# Patient Record
Sex: Male | Born: 2000 | Race: White | Hispanic: No | Marital: Single | Smoking: Never smoker
Health system: Southern US, Community
[De-identification: ages and names within clinical notes are randomized; demographics above are authoritative.]

## PROBLEM LIST (undated history)

## (undated) DIAGNOSIS — H9319 Tinnitus, unspecified ear: Secondary | ICD-10-CM

## (undated) DIAGNOSIS — S0300XA Dislocation of jaw, unspecified side, initial encounter: Secondary | ICD-10-CM

## (undated) HISTORY — PX: NO PAST SURGERIES: SHX2092

---
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
## 2021-09-14 IMAGING — CR DG CHEST 2V
2 series · 2 of 2 positions shown · non-contrast
Comparison: None.

CLINICAL DATA: Chest pain

EXAM:
CHEST - 2 VIEW

[w chest pa]
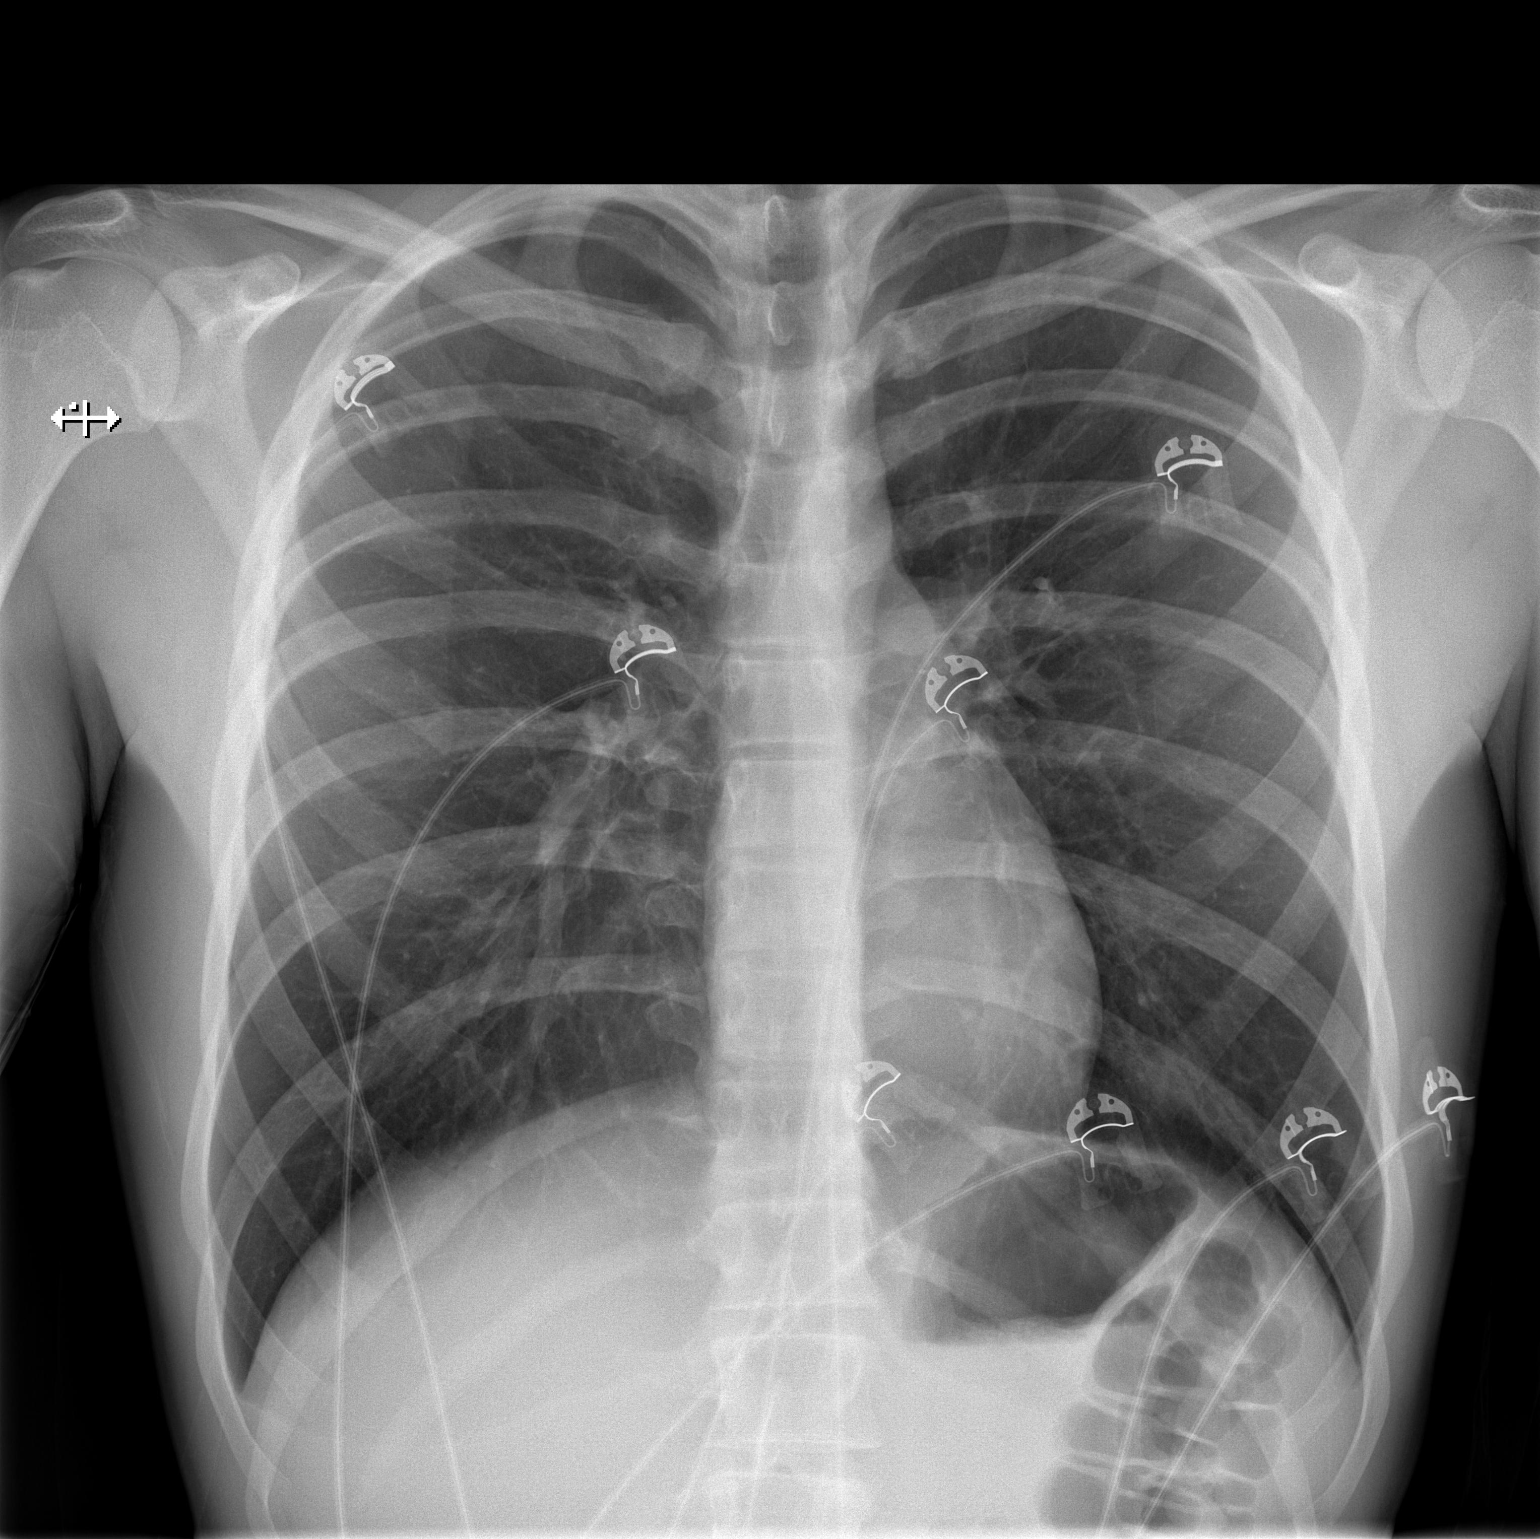

[w chest lat]
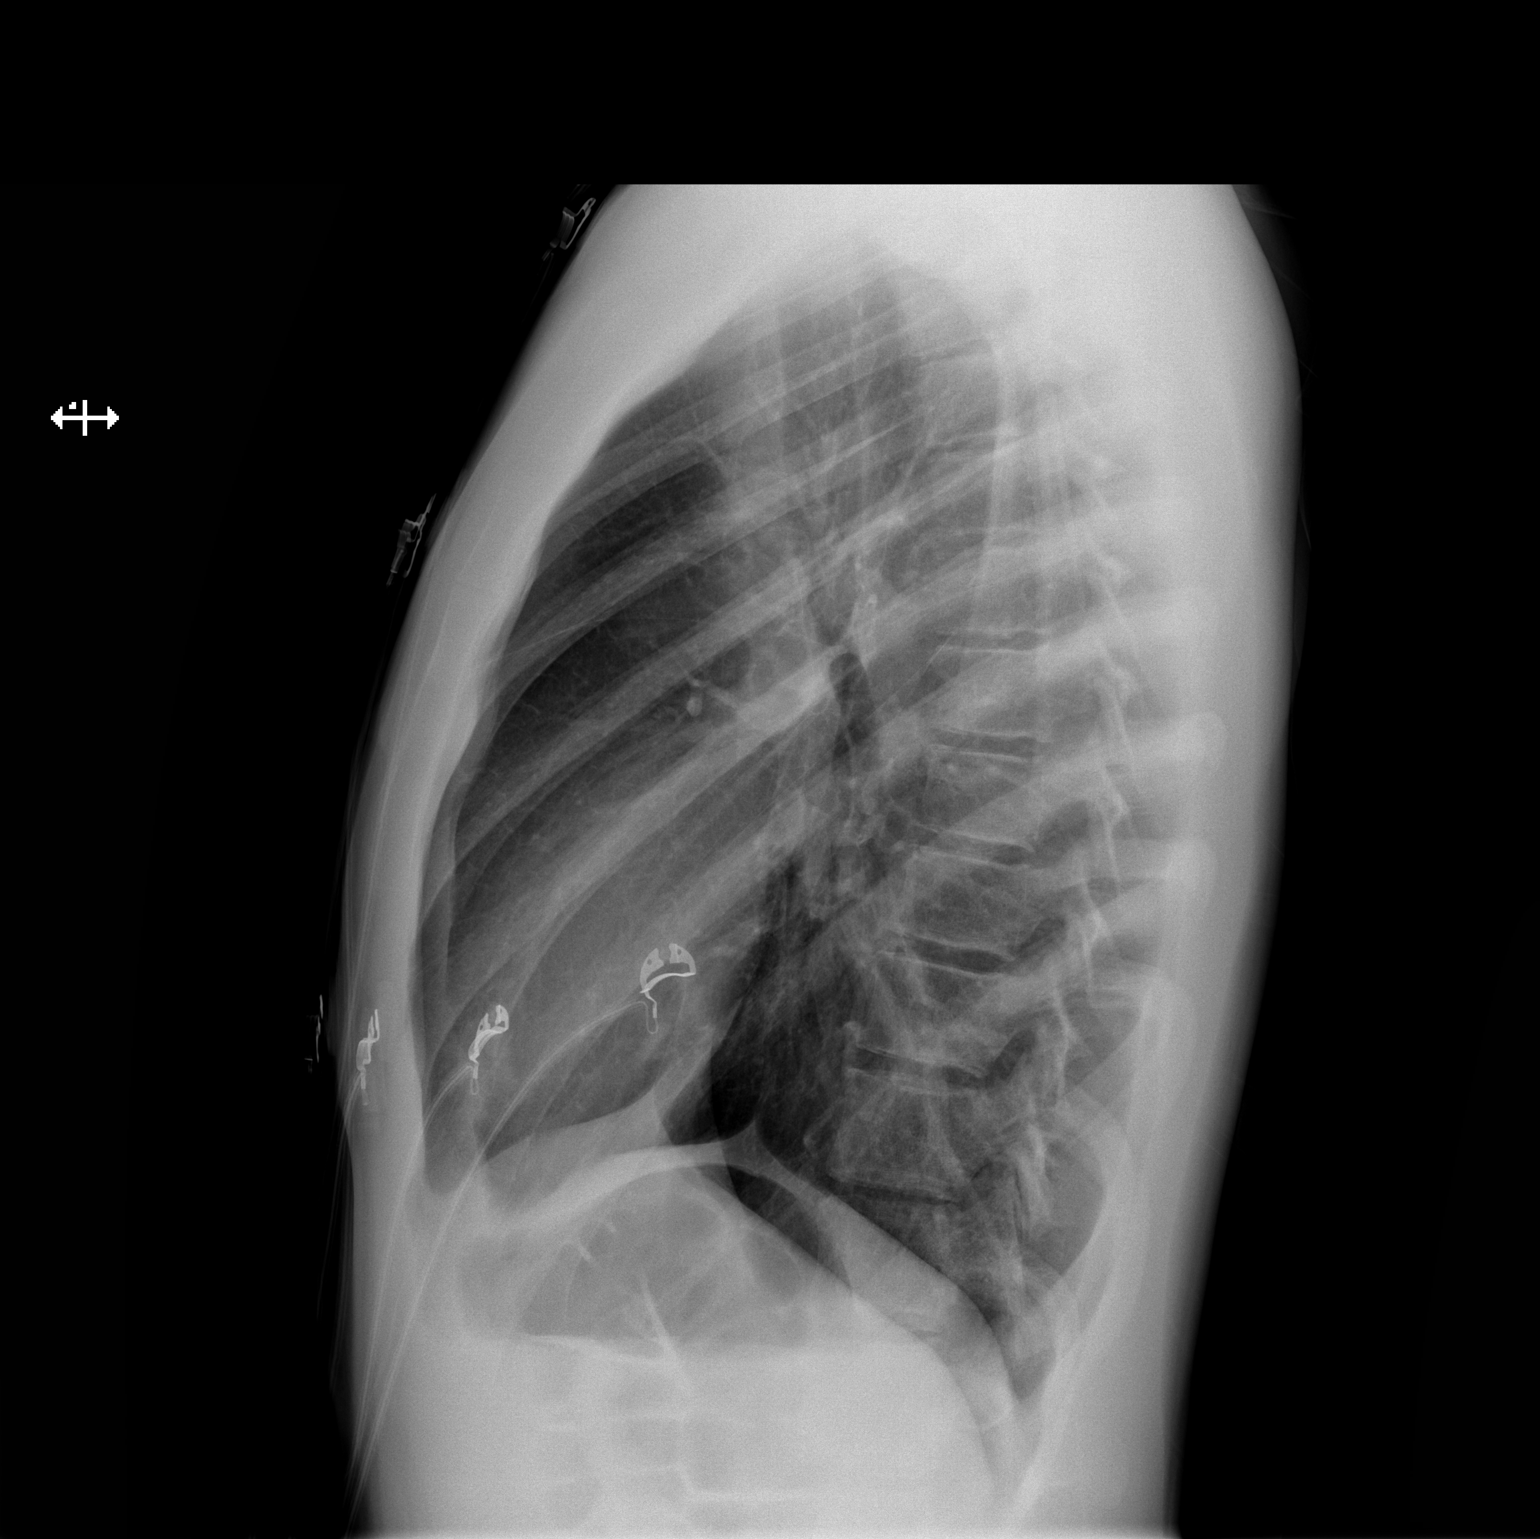

[2 of 2 positions shown; findings below may reference images not displayed]

FINDINGS: The heart size and mediastinal contours are within normal limits.
Both lungs are clear. The visualized skeletal structures are
unremarkable.
IMPRESSION: Normal study.

## 2022-07-25 ENCOUNTER — Other Ambulatory Visit: Payer: Self-pay | Admitting: Orthopaedic Surgery

## 2022-08-03 NOTE — Patient Instructions (Signed)
DUE TO COVID-19 ONLY TWO VISITORS  (aged 20 and older)  ARE ALLOWED TO COME WITH YOU AND STAY IN THE WAITING ROOM ONLY DURING PRE OP AND PROCEDURE.   **NO VISITORS ARE ALLOWED IN THE SHORT STAY AREA OR RECOVERY ROOM!!**  IF YOU WILL BE ADMITTED INTO THE HOSPITAL YOU ARE ALLOWED ONLY FOUR SUPPORT PEOPLE DURING VISITATION HOURS ONLY (7 AM -8PM)   The support person(s) must pass our screening, gel in and out, and wear a mask at all times, including in the patient's room. Patients must also wear a mask when staff or their support person are in the room. Visitors GUEST BADGE MUST BE WORN VISIBLY  One adult visitor may remain with you overnight and MUST be in the room by 8 P.M.     Your procedure is scheduled on: 08/21/22   Report to [facility] Main Entrance    Report to admitting at 9:30 AM   Call this number if you have problems the morning of surgery [phone]   Do not eat food :After Midnight.   After Midnight you may have the following liquids until _9:00_____ AM/ DAY OF SURGERY  Water Black Coffee (sugar ok, NO MILK/CREAM OR CREAMERS)  Tea (sugar ok, NO MILK/CREAM OR CREAMERS) regular and decaf                             Plain Jell-O (NO RED)                                           Fruit ices (not with fruit pulp, NO RED)                                     Popsicles (NO RED)                                                                  Juice: apple, WHITE grape, WHITE cranberry Sports drinks like Gatorade (NO RED)                   The day of surgery:  Drink ONE (1) Pre-Surgery Clear Ensure  at 8:45  AM the morning of surgery. Drink in one sitting. Do not sip.  This drink was given to you during your hospital  pre-op appointment visit. Nothing else to drink after completing the  Pre-Surgery Clear Ensure at 9:00 AM.          If you have questions, please contact your surgeon's office.       Oral Hygiene is also important to reduce your risk of  infection.                                    Remember - BRUSH YOUR TEETH THE MORNING OF SURGERY WITH YOUR REGULAR TOOTHPASTE   Do NOT smoke after Midnight   Take these medicines the morning of surgery with A SIP OF WATER: none  You may not have any metal on your body including jewelry, and body piercing             Do not wear  lotions, powders, cologne, or deodorant                Men may shave face and neck.   Do not bring valuables to the hospital. [place].   Contacts, dentures or bridgework may not be worn into surgery.   DO NOT [place].     Patients discharged on the day of surgery will not be allowed to drive home.  Someone NEEDS to stay with you for the first 24 hours after anesthesia.                 Please read over the following fact sheets you were given: IF YOU HAVE QUESTIONS ABOUT YOUR PRE-OP INSTRUCTIONS PLEASE CALL [phone]    [facility] - Preparing for Surgery Before surgery, you can play an important role.  Because skin is not sterile, your skin needs to be as free of germs as possible.  You can reduce the number of germs on your skin by washing with CHG (chlorahexidine gluconate) soap before surgery.  CHG is an antiseptic cleaner which kills germs and bonds with the skin to continue killing germs even after washing. Please DO NOT use if you have an allergy to CHG or antibacterial soaps.  If your skin becomes reddened/irritated stop using the CHG and inform your nurse when you arrive at Short Stay.  You may shave your face/neck. Please follow these instructions carefully:  1.  Shower with CHG Soap the night before surgery and the  morning of Surgery.  2.  If you choose to wash your hair, wash your hair first as usual with your  normal  shampoo.  3.  After you shampoo, rinse your hair and body thoroughly to remove the  shampoo.                             4.  Use CHG as you would any other liquid soap.  You can apply chg directly  to the skin and wash                       Gently with a scrungie or clean washcloth.  5.  Apply the CHG Soap to your body ONLY FROM THE NECK DOWN.   Do not use on face/ open                           Wound or open sores. Avoid contact with eyes, ears mouth and genitals (private parts).                       Wash face,  Genitals (private parts) with your normal soap.             6.  Wash thoroughly, paying special attention to the area where your surgery  will be performed.  7.  Thoroughly rinse your body with warm water from the neck down.  8.  DO NOT shower/wash with your normal soap after using and rinsing off  the CHG Soap.  9.  Pat yourself dry with a clean towel.            10.  Wear clean pajamas.            11.  Place clean sheets on your bed the night of your first shower and do not  sleep with pets. Day of Surgery : Do not apply any lotions/deodorants the morning of surgery.  Please wear clean clothes to the hospital/surgery center.  FAILURE TO FOLLOW THESE INSTRUCTIONS MAY RESULT IN THE CANCELLATION OF YOUR SURGERY PATIENT SIGNATURE_________________________________  NURSE SIGNATURE__________________________________  ________________________________________________________________________  Rogelia Mire  An incentive spirometer is a tool that can help keep your lungs clear and active. This tool measures how well you are filling your lungs with each breath. Taking long deep breaths may help reverse or decrease the chance of developing breathing (pulmonary) problems (especially infection) following: A long period of time when you are unable to move or be active. BEFORE THE PROCEDURE  If the spirometer includes an indicator to show your best effort, your nurse or respiratory therapist will set it to a desired goal. If possible, sit up straight or lean slightly forward. Try  not to slouch. Hold the incentive spirometer in an upright position. INSTRUCTIONS FOR USE  Sit on the edge of your bed if possible, or sit up as far as you can in bed or on a chair. Hold the incentive spirometer in an upright position. Breathe out normally. Place the mouthpiece in your mouth and seal your lips tightly around it. Breathe in slowly and as deeply as possible, raising the piston or the ball toward the top of the column. Hold your breath for 3-5 seconds or for as long as possible. Allow the piston or ball to fall to the bottom of the column. Remove the mouthpiece from your mouth and breathe out normally. Rest for a few seconds and repeat Steps 1 through 7 at least 10 times every 1-2 hours when you are awake. Take your time and take a few normal breaths between deep breaths. The spirometer may include an indicator to show your best effort. Use the indicator as a goal to work toward during each repetition. After each set of 10 deep breaths, practice coughing to be sure your lungs are clear. If you have an incision (the cut made at the time of surgery), support your incision when coughing by placing a pillow or rolled up towels firmly against it. Once you are able to get out of bed, walk around indoors and cough well. You may stop using the incentive spirometer when instructed by your caregiver.  RISKS AND COMPLICATIONS Take your time so you do not get dizzy or light-headed. If you are in pain, you may need to take or ask for pain medication before doing incentive spirometry. It is harder to take a deep breath if you are having pain. AFTER USE Rest and breathe slowly and easily. It can be helpful to keep track of a log of your progress. Your caregiver can provide you with a simple table to help with this. If you are using the spirometer at home, follow these instructions: SEEK MEDICAL CARE IF:  You are having difficultly using the spirometer. You have trouble using the spirometer as  often as instructed. Your pain medication is not giving enough relief while using the spirometer. You develop fever of 100.5 F (38.1 C) or higher. SEEK IMMEDIATE MEDICAL CARE IF:  You cough up bloody sputum that had not  been present before. You develop fever of 102 F (38.9 C) or greater. You develop worsening pain at or near the incision site. MAKE SURE YOU:  Understand these instructions. Will watch your condition. Will get help right away if you are not doing well or get worse. Document Released: 01/28/2007 Document Revised: 12/10/2011 Document Reviewed: 03/31/2007 [facility] Patient Information 2014 [place], [place].   ________________________________________________________________________

## 2022-08-06 ENCOUNTER — Encounter (HOSPITAL_COMMUNITY)
Admission: RE | Admit: 2022-08-06 | Discharge: 2022-08-06 | Disposition: A | Discharge status: Home / Self Care | Payer: BC Managed Care – PPO | Source: Ambulatory Visit | Attending: Orthopaedic Surgery | Admitting: Orthopaedic Surgery

## 2022-08-06 ENCOUNTER — Encounter (HOSPITAL_COMMUNITY): Payer: Self-pay

## 2022-08-06 ENCOUNTER — Other Ambulatory Visit: Payer: Self-pay

## 2022-08-06 HISTORY — DX: Tinnitus, unspecified ear: H93.19

## 2022-08-06 HISTORY — DX: Dislocation of jaw, unspecified side, initial encounter: S03.00XA

## 2022-08-06 NOTE — Progress Notes (Addendum)
Pt missed the PAT visit. Message was left on is mother's phone.   Pt is away at [employer]. His mother called back and PAT was done by phone. Anesthesia note:  Bowel prep reminder:  NA  PCP - Dr. Susie Cassette Cardiologist -none Other-   Chest x-ray - no EKG - no Stress Test - no ECHO - no Cardiac Cath - no CABG-no Pacemaker/ICD device last checked:NA  Sleep Study - no CPAP -   Pt is pre diabetic-no CBG at PAT visit- Fasting Blood Sugar at home- Checks Blood Sugar _____  Blood Thinner:no Blood Thinner Instructions: Aspirin Instructions: Last Dose:  Anesthesia review: /No    Patient denies shortness of breath, fever, cough and chest pain at PAT appointment Pt is in good shape no SOB with activities  Patient verbalized understanding of instructions that were given to them at the PAT appointment. Patient was also instructed that they will need to review over the PAT instructions again at home before surgery.yes read to Pt's mother and questions answered.

## 2022-08-20 NOTE — H&P (Signed)
Jared Camacho is an 21 y.o. male.   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  He is here with his dad who we have seen in the past.  His dad was a physician in [place] and is now trained as a Education officer, environmental.  Nevertheless Jared Camacho has had trouble with his right knee for about a year.  He feels it started while playing lacrosse.  He has persisted with outside aspect pain despite physical therapy, ibuprofen, and a brace.  MRI:  I reviewed an MRI scan films and report of a study done at the [facility] on 05/17/22.  This shows a complex tear of the lateral meniscus.  Past Medical History:  Diagnosis Date   Tinnitus    TMJ (dislocation of temporomandibular joint)     Past Surgical History:  Procedure Laterality Date   NO PAST SURGERIES      No family history on file. Social History:  reports that he has never smoked. He has never used smokeless tobacco. He reports that he does not currently use alcohol. He reports that he does not use drugs.  Allergies: No Known Allergies  No medications prior to admission.    No results found for this or any previous visit (from the past 48 hour(s)). No results found.  Review of Systems  Musculoskeletal:  Positive for arthralgias.       Right knee  All other systems reviewed and are negative.   There were no vitals taken for this visit. Physical Exam Constitutional:      Appearance: Normal appearance.  HENT:     Head: Normocephalic and atraumatic.     Mouth/Throat:     Pharynx: Oropharynx is clear.  Eyes:     Extraocular Movements: Extraocular movements intact.  Pulmonary:     Effort: Pulmonary effort is normal.  Abdominal:     Palpations: Abdomen is soft.  Musculoskeletal:     Cervical back: Normal range of motion.     Comments: Right knee has trace effusion.  He has some lateral joint line pain and pain on hyperextension.  His motion is full.  Murray's test is positive for pain and a pop and  lateral direction.  Ligaments are stable.    Skin:    General: Skin is warm and dry.  Neurological:     General: No focal deficit present.     Mental Status: He is alert and oriented to person, place, and time.  Psychiatric:        Mood and Affect: Mood normal.        Behavior: Behavior normal.        Thought Content: Thought content normal.        Judgment: Judgment normal.      Assessment/Plan Assessment:   Right knee torn lateral meniscus by MRI 2023  Plan: Jared Camacho really needs to consider a knee arthroscopy.  He has been symptomatic for about a year despite physical therapy, anti-inflammatories, and a brace.  He has trouble working out and running. I reviewed risk of anesthesia, infection, DVT.  Ginger Organ Tillie Viverette, PA-C 08/20/2022, 1:49 PM

## 2022-08-20 NOTE — Anesthesia Preprocedure Evaluation (Addendum)
Anesthesia Evaluation  Patient identified by MRN, date of birth, ID band Patient awake    Reviewed: Allergy & Precautions, NPO status , Patient's Chart, lab work & pertinent test results  Airway Mallampati: II  TM Distance: >3 FB Neck ROM: Full    Dental no notable dental hx. (+) Teeth Intact, Dental Advisory Given   Pulmonary neg pulmonary ROS   Pulmonary exam normal breath sounds clear to auscultation       Cardiovascular Exercise Tolerance: Good Normal cardiovascular exam Rhythm:Regular Rate:Normal     Neuro/Psych negative neurological ROS  negative psych ROS   GI/Hepatic Neg liver ROS,,,  Endo/Other  negative endocrine ROS    Renal/GU negative Renal ROS  negative genitourinary   Musculoskeletal   Abdominal   Peds negative pediatric ROS (+)  Hematology negative hematology ROS (+)   Anesthesia Other Findings   Reproductive/Obstetrics                             Anesthesia Physical Anesthesia Plan  ASA: 1  Anesthesia Plan: General   Post-op Pain Management: Dilaudid IV, Precedex, Minimal or no pain anticipated and Ofirmev IV (intra-op)*   Induction: Intravenous  PONV Risk Score and Plan: 3 and Treatment may vary due to age or medical condition, Ondansetron and Midazolam  Airway Management Planned: LMA  Additional Equipment: None  Intra-op Plan:   Post-operative Plan:   Informed Consent: I have reviewed the patients History and Physical, chart, labs and discussed the procedure including the risks, benefits and alternatives for the proposed anesthesia with the patient or authorized representative who has indicated his/her understanding and acceptance.     Dental advisory given  Plan Discussed with:   Anesthesia Plan Comments:        Anesthesia Quick Evaluation

## 2022-08-21 ENCOUNTER — Other Ambulatory Visit: Payer: Self-pay

## 2022-08-21 ENCOUNTER — Ambulatory Visit (HOSPITAL_COMMUNITY): Payer: BC Managed Care – PPO | Admitting: Anesthesiology

## 2022-08-21 ENCOUNTER — Ambulatory Visit (HOSPITAL_COMMUNITY)
Admission: RE | Admit: 2022-08-21 | Discharge: 2022-08-21 | Disposition: A | Discharge status: Home / Self Care | Payer: BC Managed Care – PPO | Attending: Orthopaedic Surgery | Admitting: Orthopaedic Surgery

## 2022-08-21 ENCOUNTER — Encounter (HOSPITAL_COMMUNITY)
Admission: RE | Disposition: A | Discharge status: Home / Self Care | Payer: Self-pay | Source: Home / Self Care | Attending: Orthopaedic Surgery

## 2022-08-21 ENCOUNTER — Encounter (HOSPITAL_COMMUNITY): Payer: Self-pay | Admitting: Orthopaedic Surgery

## 2022-08-21 DIAGNOSIS — S83271A Complex tear of lateral meniscus, current injury, right knee, initial encounter: Secondary | ICD-10-CM | POA: Insufficient documentation

## 2022-08-21 DIAGNOSIS — X58XXXA Exposure to other specified factors, initial encounter: Secondary | ICD-10-CM | POA: Insufficient documentation

## 2022-08-21 DIAGNOSIS — Z9889 Other specified postprocedural states: Secondary | ICD-10-CM

## 2022-08-21 DIAGNOSIS — Z01818 Encounter for other preprocedural examination: Secondary | ICD-10-CM

## 2022-08-21 HISTORY — PX: KNEE ARTHROSCOPY: SHX127

## 2022-08-21 SURGERY — ARTHROSCOPY, KNEE
Anesthesia: General | Site: Knee | Laterality: Right

## 2022-08-21 MED ORDER — ONDANSETRON HCL 4 MG/2ML IJ SOLN
4.0000 mg | Freq: Once | INTRAMUSCULAR | Status: AC | PRN
  Administered 2022-08-21: 4 mg via INTRAVENOUS

## 2022-08-21 MED ORDER — HYDROMORPHONE HCL 1 MG/ML IJ SOLN: 0.2500 mg | INTRAMUSCULAR | Status: DC | PRN

## 2022-08-21 MED ORDER — DEXMEDETOMIDINE HCL IN NACL 80 MCG/20ML IV SOLN
INTRAVENOUS | Status: DC | PRN
  Administered 2022-08-21: 16 ug via BUCCAL

## 2022-08-21 MED ORDER — LACTATED RINGERS IV SOLN: INTRAVENOUS | Status: DC

## 2022-08-21 MED ORDER — DEXAMETHASONE SODIUM PHOSPHATE 10 MG/ML IJ SOLN
INTRAMUSCULAR | Status: AC
  Filled 2022-08-21: qty 1

## 2022-08-21 MED ORDER — ONDANSETRON HCL 4 MG/2ML IJ SOLN
INTRAMUSCULAR | Status: AC
  Filled 2022-08-21: qty 2

## 2022-08-21 MED ORDER — OXYCODONE HCL 5 MG PO TABS: 5.0000 mg | ORAL_TABLET | Freq: Once | ORAL | Status: DC | PRN

## 2022-08-21 MED ORDER — PROPOFOL 10 MG/ML IV BOLUS
INTRAVENOUS | Status: AC
  Filled 2022-08-21: qty 20

## 2022-08-21 MED ORDER — BUPIVACAINE-EPINEPHRINE (PF) 0.5% -1:200000 IJ SOLN
INTRAMUSCULAR | Status: AC
  Filled 2022-08-21: qty 30

## 2022-08-21 MED ORDER — CEFAZOLIN SODIUM-DEXTROSE 2-4 GM/100ML-% IV SOLN
2.0000 g | INTRAVENOUS | Status: AC
  Administered 2022-08-21: 2 g via INTRAVENOUS
  Filled 2022-08-21: qty 100

## 2022-08-21 MED ORDER — EPINEPHRINE PF 1 MG/ML IJ SOLN
INTRAMUSCULAR | Status: AC
  Filled 2022-08-21: qty 1

## 2022-08-21 MED ORDER — KETOROLAC TROMETHAMINE 30 MG/ML IJ SOLN
30.0000 mg | Freq: Once | INTRAMUSCULAR | Status: AC | PRN
  Administered 2022-08-21: 30 mg via INTRAVENOUS

## 2022-08-21 MED ORDER — ONDANSETRON HCL 4 MG/2ML IJ SOLN
INTRAMUSCULAR | Status: DC | PRN
  Administered 2022-08-21: 4 mg via INTRAVENOUS

## 2022-08-21 MED ORDER — LIDOCAINE HCL (PF) 2 % IJ SOLN
INTRAMUSCULAR | Status: AC
  Filled 2022-08-21: qty 5

## 2022-08-21 MED ORDER — DEXMEDETOMIDINE HCL IN NACL 80 MCG/20ML IV SOLN
INTRAVENOUS | Status: AC
  Filled 2022-08-21: qty 20

## 2022-08-21 MED ORDER — KETOROLAC TROMETHAMINE 30 MG/ML IJ SOLN
INTRAMUSCULAR | Status: AC
  Filled 2022-08-21: qty 1

## 2022-08-21 MED ORDER — CHLORHEXIDINE GLUCONATE 0.12 % MT SOLN
15.0000 mL | Freq: Once | OROMUCOSAL | Status: AC
  Administered 2022-08-21: 15 mL via OROMUCOSAL

## 2022-08-21 MED ORDER — MIDAZOLAM HCL 2 MG/2ML IJ SOLN
INTRAMUSCULAR | Status: AC
  Filled 2022-08-21: qty 2

## 2022-08-21 MED ORDER — FENTANYL CITRATE (PF) 100 MCG/2ML IJ SOLN
INTRAMUSCULAR | Status: AC
  Filled 2022-08-21: qty 2

## 2022-08-21 MED ORDER — FENTANYL CITRATE (PF) 100 MCG/2ML IJ SOLN
INTRAMUSCULAR | Status: DC | PRN
  Administered 2022-08-21: 100 ug via INTRAVENOUS
  Administered 2022-08-21: 50 ug via INTRAVENOUS

## 2022-08-21 MED ORDER — BUPIVACAINE-EPINEPHRINE 0.5% -1:200000 IJ SOLN
INTRAMUSCULAR | Status: DC | PRN
  Administered 2022-08-21: 20 mL

## 2022-08-21 MED ORDER — SODIUM CHLORIDE 0.9 % IR SOLN
Status: DC | PRN
  Administered 2022-08-21: 1 mL

## 2022-08-21 MED ORDER — DEXAMETHASONE SODIUM PHOSPHATE 10 MG/ML IJ SOLN
INTRAMUSCULAR | Status: DC | PRN
  Administered 2022-08-21: 10 mg via INTRAVENOUS

## 2022-08-21 MED ORDER — PROPOFOL 10 MG/ML IV BOLUS
INTRAVENOUS | Status: DC | PRN
  Administered 2022-08-21: 30 mg via INTRAVENOUS
  Administered 2022-08-21: 20 mg via INTRAVENOUS
  Administered 2022-08-21: 200 mg via INTRAVENOUS

## 2022-08-21 MED ORDER — LIDOCAINE 2% (20 MG/ML) 5 ML SYRINGE
INTRAMUSCULAR | Status: DC | PRN
  Administered 2022-08-21: 100 mg via INTRAVENOUS

## 2022-08-21 MED ORDER — ORAL CARE MOUTH RINSE: 15.0000 mL | Freq: Once | OROMUCOSAL | Status: AC

## 2022-08-21 MED ORDER — OXYCODONE HCL 5 MG/5ML PO SOLN: 5.0000 mg | Freq: Once | ORAL | Status: DC | PRN

## 2022-08-21 MED ORDER — MIDAZOLAM HCL 5 MG/5ML IJ SOLN
INTRAMUSCULAR | Status: DC | PRN
  Administered 2022-08-21: 2 mg via INTRAVENOUS

## 2022-08-21 MED ORDER — SODIUM CHLORIDE 0.9 % IR SOLN
Status: DC | PRN
  Administered 2022-08-21: 3000 mL

## 2022-08-21 SURGICAL SUPPLY — 36 items
BAG COUNTER SPONGE SURGICOUNT (BAG) ×1 IMPLANT
BLADE EXCALIBUR 4.0X13 (MISCELLANEOUS) IMPLANT
BNDG ELASTIC 6X10 VLCR STRL LF (GAUZE/BANDAGES/DRESSINGS) IMPLANT
BNDG ELASTIC 6X5.8 VLCR STR LF (GAUZE/BANDAGES/DRESSINGS) ×1 IMPLANT
BNDG GAUZE DERMACEA FLUFF 4 (GAUZE/BANDAGES/DRESSINGS) ×1 IMPLANT
BONE TUNNEL PLUG CANNULATED (MISCELLANEOUS) ×1 IMPLANT
BURR OVAL 8 FLU 4.0X13 (MISCELLANEOUS) ×1 IMPLANT
COVER SURGICAL LIGHT HANDLE (MISCELLANEOUS) IMPLANT
DISSECTOR 3.5MM X 13CM (MISCELLANEOUS) ×1 IMPLANT
DRAPE ARTHROSCOPY W/POUCH 114 (DRAPES) ×1 IMPLANT
DRAPE SHEET LG 3/4 BI-LAMINATE (DRAPES) ×1 IMPLANT
DRAPE U-SHAPE 47X51 STRL (DRAPES) ×1 IMPLANT
DRSG ADAPTIC 3X8 NADH LF (GAUZE/BANDAGES/DRESSINGS) IMPLANT
DRSG EMULSION OIL 3X3 NADH (GAUZE/BANDAGES/DRESSINGS) ×1 IMPLANT
DURAPREP 26ML APPLICATOR (WOUND CARE) ×2 IMPLANT
GAUZE 4X4 16PLY ~~LOC~~+RFID DBL (SPONGE) ×1 IMPLANT
GAUZE PAD ABD 8X10 STRL (GAUZE/BANDAGES/DRESSINGS) ×1 IMPLANT
GAUZE SPONGE 4X4 12PLY STRL (GAUZE/BANDAGES/DRESSINGS) ×1 IMPLANT
GLOVE BIO SURGEON STRL SZ8 (GLOVE) ×2 IMPLANT
GLOVE BIOGEL PI IND STRL 8 (GLOVE) ×2 IMPLANT
GOWN STRL REUS W/ TWL XL LVL3 (GOWN DISPOSABLE) ×2 IMPLANT
GOWN STRL REUS W/TWL XL LVL3 (GOWN DISPOSABLE) ×2
KIT BASIN OR (CUSTOM PROCEDURE TRAY) ×1 IMPLANT
MANIFOLD NEPTUNE II (INSTRUMENTS) ×1 IMPLANT
NDL SPNL 18GX3.5 QUINCKE PK (NEEDLE) IMPLANT
NEEDLE HYPO 22GX1.5 SAFETY (NEEDLE) ×1 IMPLANT
NEEDLE SPNL 18GX3.5 QUINCKE PK (NEEDLE) IMPLANT
PACK ARTHROSCOPY WL (CUSTOM PROCEDURE TRAY) ×1 IMPLANT
PAD ARMBOARD 7.5X6 YLW CONV (MISCELLANEOUS) ×2 IMPLANT
PAD CAST 3X4 CTTN HI CHSV (CAST SUPPLIES) IMPLANT
PADDING CAST COTTON 3X4 STRL (CAST SUPPLIES) ×1
PENCIL SMOKE EVACUATOR (MISCELLANEOUS) IMPLANT
SYR CONTROL 10ML LL (SYRINGE) ×1 IMPLANT
TOWEL OR 17X26 10 PK STRL BLUE (TOWEL DISPOSABLE) ×1 IMPLANT
TUBING ARTHROSCOPY IRRIG 16FT (MISCELLANEOUS) ×1 IMPLANT
WATER STERILE IRR 1000ML POUR (IV SOLUTION) ×1 IMPLANT

## 2022-08-21 NOTE — Progress Notes (Signed)
Orthopedic Tech Progress Note Patient Details:  Jared Camacho [birth year] [id number]  Patient ID: Jared Camacho, male   DOB: 2000-11-15, 21 y.o.   MRN: [id number]  Jared Camacho 08/21/2022, 12:05 PM Crutches sized and instructed on use in pacu per order from [employer]

## 2022-08-21 NOTE — Brief Op Note (Signed)
Dominik Yordy [id number] 08/21/2022   PRE-OP DIAGNOSIS: right knee TLM  POST-OP DIAGNOSIS: same  PROCEDURE: right knee scope PLM  ANESTHESIA: general  Velna Ochs   Dictation #:  [id number]

## 2022-08-21 NOTE — Op Note (Signed)
Jared Camacho, Jared Camacho MEDICAL RECORD NO: [id number] ACCOUNT NO: [id number] DATE OF BIRTH: 13-May-2001 FACILITY: [facility] LOCATION: [facility] PHYSICIAN: Lubertha Basque. [name], MD  Operative Report   DATE OF PROCEDURE: 08/21/2022  PREOPERATIVE DIAGNOSIS:  Right knee torn lateral meniscus.  POSTOPERATIVE DIAGNOSIS:  Right knee torn lateral meniscus.  PROCEDURE:  Right knee partial lateral meniscectomy.  ANESTHESIA:  General.  ATTENDING SURGEON:  Lubertha Basque. [name], MD.  ASSISTANT:  Elodia Florence, PA.  INDICATIONS FOR THE PROCEDURE:  The patient is a 21 year old student with about a year of right knee pain and mechanical symptoms.  This has persisted despite activity restriction, bracing and anti-inflammatories.  By MRI scan, he has a torn lateral  meniscus.  He is offered an arthroscopy.  Informed operative consent was obtained after discussion of possible complications including, reaction to anesthesia and infection.    SUMMARY OF FINDINGS AND PROCEDURE:  Under general anesthesia, an arthroscopy of the right knee was performed.  Suprapatellar pouch was benign as was the patellofemoral joint.  Medial compartment was benign with no evidence of meniscal or articular  cartilage injury.  ACL was intact.  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  This was addressed with about a 10% partial lateral meniscectomy done with basket and shaver back to stable tissues.  He was scheduled to  go home same day.    DESCRIPTION OF PROCEDURE:  The patient was taken to the operating suite where general anesthetic was applied without difficulty.  He was positioned supine and prepped and draped in normal sterile fashion.  After the administration of preoperative IV  Kefzol and appropriate time-out, an arthroscopy of the right knee was performed through a total of 2 portals.  Findings were as noted above and procedure consisted solely of the partial lateral meniscectomy done with a  basket and shaver back to stable  tissues.  He was really left with a somewhat normal appearing and normal sized lateral meniscus as he started with a discoid variant.  He had no degenerative changes in this compartment.  The knee was thoroughly irrigated, followed by removal of  arthroscopic equipment.  We placed Adaptic over the portals followed by dry gauze and a loose Ace wrap.   ESTIMATED BLOOD LOSS AND INTRAOPERATIVE FLUIDS: Can be obtained from anesthesia records.    DISPOSITION: The patient was extubated in the operating room and taken to recovery room in stable condition.  Plans were for him to go home the same day and follow up in the office in less than week.  I will contact him by phone tonight.      PAA D: 08/21/2022 10:37:31 am T: 08/21/2022 10:55:00 am  JOB: [id number]/ [id number]

## 2022-08-21 NOTE — Anesthesia Postprocedure Evaluation (Signed)
Anesthesia Post Note  Patient: Jared Camacho  Procedure(s) Performed: RIGHT KNEE ARTHROSCOPY Partial Lateral Menisectomy (Right: Knee)     Patient location during evaluation: PACU Anesthesia Type: General Level of consciousness: awake and alert Pain management: pain level controlled Vital Signs Assessment: post-procedure vital signs reviewed and stable Respiratory status: spontaneous breathing, nonlabored ventilation, respiratory function stable and patient connected to nasal cannula oxygen Cardiovascular status: blood pressure returned to baseline and stable Postop Assessment: no apparent nausea or vomiting Anesthetic complications: no  No notable events documented.  Last Vitals:  Vitals:   08/21/22 1145 08/21/22 1200  BP: 124/68 (!) 119/55  Pulse: 70 71  Resp: 16 15  Temp:    SpO2: 98% 98%    Last Pain:  Vitals:   08/21/22 1200  TempSrc:   PainSc: 0-No pain                 Trevor Iha

## 2022-08-21 NOTE — Transfer of Care (Signed)
Immediate Anesthesia Transfer of Care Note  Patient: Jared Camacho  Procedure(s) Performed: RIGHT KNEE ARTHROSCOPY Partial Lateral Menisectomy (Right: Knee)  Patient Location: PACU  Anesthesia Type:General  Level of Consciousness: sedated  Airway & Oxygen Therapy: Patient Spontanous Breathing and Patient connected to face mask oxygen  Post-op Assessment: Report given to RN and Post -op Vital signs reviewed and stable  Post vital signs: Reviewed and stable  Last Vitals:  Vitals Value Taken Time  BP    Temp    Pulse 66 08/21/22 1039  Resp    SpO2 100 % 08/21/22 1039  Vitals shown include unvalidated device data.  Last Pain:  Vitals:   08/21/22 0745  TempSrc: Oral         Complications: No notable events documented.

## 2022-08-21 NOTE — Interval H&P Note (Signed)
History and Physical Interval Note:  08/21/2022 9:02 AM  Jared Camacho  has presented today for surgery, with the diagnosis of RIGHT KNEE LATERAL MENISCUS TEAR.  The various methods of treatment have been discussed with the patient and family. After consideration of risks, benefits and other options for treatment, the patient has consented to  Procedure(s): RIGHT KNEE ARTHROSCOPY (Right) as a surgical intervention.  The patient's history has been reviewed, patient examined, no change in status, stable for surgery.  I have reviewed the patient's chart and labs.  Questions were answered to the patient's satisfaction.     Velna Ochs

## 2022-08-21 NOTE — Anesthesia Procedure Notes (Signed)
Procedure Name: LMA Insertion Date/Time: 08/21/2022 9:58 AM  Performed by: Doran Clay, CRNAPre-anesthesia Checklist: Patient identified, Emergency Drugs available, Suction available, Patient being monitored and Timeout performed Patient Re-evaluated:Patient Re-evaluated prior to induction Oxygen Delivery Method: Circle system utilized Preoxygenation: Pre-oxygenation with 100% oxygen Induction Type: IV induction LMA: LMA inserted LMA Size: 5.0 Tube type: Oral Number of attempts: 1 Placement Confirmation: positive ETCO2 and breath sounds checked- equal and bilateral Tube secured with: Tape Dental Injury: Teeth and Oropharynx as per pre-operative assessment

## 2022-08-22 ENCOUNTER — Encounter (HOSPITAL_COMMUNITY): Payer: Self-pay | Admitting: Orthopaedic Surgery
